# Patient Record
Sex: Female | Born: 1965 | Race: Black or African American | Hispanic: No | Smoking: Former smoker
Health system: Southern US, Community
[De-identification: ages and names within clinical notes are randomized; demographics above are authoritative.]

## PROBLEM LIST (undated history)

## (undated) DIAGNOSIS — J302 Other seasonal allergic rhinitis: Secondary | ICD-10-CM

## (undated) DIAGNOSIS — M1990 Unspecified osteoarthritis, unspecified site: Secondary | ICD-10-CM

## (undated) HISTORY — PX: BILATERAL CARPAL TUNNEL RELEASE: SHX6508

## (undated) HISTORY — PX: ABDOMINAL HYSTERECTOMY: SHX81

## (undated) HISTORY — PX: SALPINGECTOMY: SHX328

---

## 2008-10-31 ENCOUNTER — Emergency Department (HOSPITAL_COMMUNITY): Admission: EM | Admit: 2008-10-31 | Discharge: 2008-11-01 | Payer: Self-pay | Admitting: Emergency Medicine

## 2009-11-27 ENCOUNTER — Inpatient Hospital Stay (HOSPITAL_COMMUNITY)
Admission: EM | Admit: 2009-11-27 | Discharge: 2009-12-01 | Payer: Self-pay | Source: Home / Self Care | Admitting: Emergency Medicine

## 2009-11-29 ENCOUNTER — Encounter: Payer: Self-pay | Admitting: Obstetrics and Gynecology

## 2009-12-27 ENCOUNTER — Inpatient Hospital Stay (HOSPITAL_COMMUNITY)
Admission: RE | Admit: 2009-12-27 | Discharge: 2009-12-30 | Payer: Self-pay | Source: Home / Self Care | Attending: Obstetrics and Gynecology | Admitting: Obstetrics and Gynecology

## 2009-12-27 ENCOUNTER — Encounter: Payer: Self-pay | Admitting: Obstetrics and Gynecology

## 2010-03-03 ENCOUNTER — Emergency Department (HOSPITAL_COMMUNITY)
Admission: EM | Admit: 2010-03-03 | Discharge: 2010-03-03 | Disposition: A | Discharge status: Home / Self Care | Payer: BC Managed Care – PPO | Attending: Emergency Medicine | Admitting: Emergency Medicine

## 2010-03-03 DIAGNOSIS — R109 Unspecified abdominal pain: Secondary | ICD-10-CM | POA: Insufficient documentation

## 2010-03-03 DIAGNOSIS — G8918 Other acute postprocedural pain: Secondary | ICD-10-CM | POA: Insufficient documentation

## 2010-03-03 LAB — BASIC METABOLIC PANEL
CO2: 22 mEq/L (ref 19–32)
Calcium: 8.9 mg/dL (ref 8.4–10.5)
Chloride: 109 mEq/L (ref 96–112)
Creatinine, Ser: 0.82 mg/dL (ref 0.4–1.2)
GFR calc Af Amer: >60 mL/min (ref >60)
Sodium: 137 mEq/L (ref 135–145)

## 2010-03-03 LAB — URINALYSIS, ROUTINE W REFLEX MICROSCOPIC
Bilirubin Urine: NEGATIVE
Hgb urine dipstick: NEGATIVE
Protein, ur: NEGATIVE mg/dL
Urobilinogen, UA: 0.2 mg/dL (ref 0.0–1.0)

## 2010-03-03 LAB — POCT I-STAT, CHEM 8
Calcium, Ion: 1.1 mmol/L — ABNORMAL LOW (ref 1.12–1.32)
Creatinine, Ser: 0.9 mg/dL (ref 0.4–1.2)
Glucose, Bld: 92 mg/dL (ref 70–99)
HCT: 35 % — ABNORMAL LOW (ref 36.0–46.0)
Potassium: 3.8 mEq/L (ref 3.5–5.1)

## 2010-03-28 LAB — DIFFERENTIAL
Basophils Absolute: 0 10*3/uL (ref 0.0–0.1)
Basophils Relative: 1 % (ref 0–1)
Eosinophils Absolute: 0 10*3/uL (ref 0.0–0.7)
Eosinophils Absolute: 0 10*3/uL (ref 0.0–0.7)
Eosinophils Relative: 3 % (ref 0–5)
Eosinophils Relative: 0 % (ref 0–5)
Eosinophils Relative: 0 % (ref 0–5)
Lymphocytes Relative: 27 % (ref 12–46)
Lymphocytes Relative: 13 % (ref 12–46)
Lymphocytes Relative: 14 % (ref 12–46)
Lymphs Abs: 1.7 10*3/uL (ref 0.7–4.0)
Lymphs Abs: 0.8 10*3/uL (ref 0.7–4.0)
Lymphs Abs: 1.2 10*3/uL (ref 0.7–4.0)
Lymphs Abs: 1.5 10*3/uL (ref 0.7–4.0)
Lymphs Abs: 1.6 10*3/uL (ref 0.7–4.0)
Monocytes Absolute: 0.6 10*3/uL (ref 0.1–1.0)
Monocytes Relative: 6 % (ref 3–12)
Monocytes Relative: 7 % (ref 3–12)
Neutro Abs: 5.8 10*3/uL (ref 1.7–7.7)
Neutro Abs: 8.5 10*3/uL — ABNORMAL HIGH (ref 1.7–7.7)
Neutro Abs: 9.9 10*3/uL — ABNORMAL HIGH (ref 1.7–7.7)
Neutrophils Relative %: 68 % (ref 43–77)
Neutrophils Relative %: 79 % — ABNORMAL HIGH (ref 43–77)
Neutrophils Relative %: 80 % — ABNORMAL HIGH (ref 43–77)
Neutrophils Relative %: 85 % — ABNORMAL HIGH (ref 43–77)

## 2010-03-28 LAB — GC/CHLAMYDIA PROBE AMP, GENITAL
GC Probe Amp, Genital: NEGATIVE
GC Probe Amp, Genital: NEGATIVE

## 2010-03-28 LAB — COMPREHENSIVE METABOLIC PANEL
ALT: 15 U/L (ref 0–35)
ALT: 13 U/L (ref 0–35)
AST: 18 U/L (ref 0–37)
AST: 16 U/L (ref 0–37)
Alkaline Phosphatase: 82 U/L (ref 39–117)
CO2: 23 mEq/L (ref 19–32)
Calcium: 8.8 mg/dL (ref 8.4–10.5)
Chloride: 105 mEq/L (ref 96–112)
GFR calc Af Amer: >60 mL/min (ref >60)
GFR calc Af Amer: >60 mL/min (ref >60)
GFR calc non Af Amer: >60 mL/min (ref >60)
Potassium: 4.2 mEq/L (ref 3.5–5.1)
Sodium: 138 mEq/L (ref 135–145)
Sodium: 135 mEq/L (ref 135–145)
Total Bilirubin: 0.6 mg/dL (ref 0.3–1.2)
Total Protein: 7.1 g/dL (ref 6.0–8.3)

## 2010-03-28 LAB — URINE CULTURE: Colony Count: 50000

## 2010-03-28 LAB — POCT I-STAT 4, (NA,K, GLUC, HGB,HCT)
HCT: 30 % — ABNORMAL LOW (ref 36.0–46.0)
Hemoglobin: 10.2 g/dL — ABNORMAL LOW (ref 12.0–15.0)
Potassium: 4.2 mEq/L (ref 3.5–5.1)

## 2010-03-28 LAB — WOUND CULTURE: Culture: NO GROWTH

## 2010-03-28 LAB — CBC
Hemoglobin: 8.2 g/dL — ABNORMAL LOW (ref 12.0–15.0)
Hemoglobin: 9.7 g/dL — ABNORMAL LOW (ref 12.0–15.0)
Hemoglobin: 10.4 g/dL — ABNORMAL LOW (ref 12.0–15.0)
Hemoglobin: 9.4 g/dL — ABNORMAL LOW (ref 12.0–15.0)
Hemoglobin: 9.6 g/dL — ABNORMAL LOW (ref 12.0–15.0)
MCH: 24.8 pg — ABNORMAL LOW (ref 26.0–34.0)
MCHC: 32.3 g/dL (ref 30.0–36.0)
MCHC: 32.9 g/dL (ref 30.0–36.0)
MCV: 76.9 fL — ABNORMAL LOW (ref 78.0–100.0)
MCV: 76 fL — ABNORMAL LOW (ref 78.0–100.0)
MCV: 76.6 fL — ABNORMAL LOW (ref 78.0–100.0)
Platelets: 403 10*3/uL — ABNORMAL HIGH (ref 150–400)
Platelets: 373 10*3/uL (ref 150–400)
Platelets: 407 10*3/uL — ABNORMAL HIGH (ref 150–400)
RBC: 3.42 MIL/uL — ABNORMAL LOW (ref 3.87–5.11)
RBC: 3.45 MIL/uL — ABNORMAL LOW (ref 3.87–5.11)
RBC: 3.78 MIL/uL — ABNORMAL LOW (ref 3.87–5.11)
RBC: 3.87 MIL/uL (ref 3.87–5.11)
RBC: 4.22 MIL/uL (ref 3.87–5.11)
RDW: 17.9 % — ABNORMAL HIGH (ref 11.5–15.5)
RDW: 19.9 % — ABNORMAL HIGH (ref 11.5–15.5)
WBC: 4.2 10*3/uL (ref 4.0–10.5)
WBC: 6.4 10*3/uL (ref 4.0–10.5)
WBC: 10.7 10*3/uL — ABNORMAL HIGH (ref 4.0–10.5)
WBC: 12.3 10*3/uL — ABNORMAL HIGH (ref 4.0–10.5)
WBC: 12.9 10*3/uL — ABNORMAL HIGH (ref 4.0–10.5)

## 2010-03-28 LAB — CULTURE, BLOOD (ROUTINE X 2): Culture: NO GROWTH

## 2010-03-28 LAB — URINE MICROSCOPIC-ADD ON

## 2010-03-28 LAB — ANAEROBIC CULTURE

## 2010-03-28 LAB — URINALYSIS, ROUTINE W REFLEX MICROSCOPIC
Bilirubin Urine: NEGATIVE
Glucose, UA: 100 mg/dL — AB
Ketones, ur: NEGATIVE mg/dL
pH: 7 (ref 5.0–8.0)

## 2010-03-28 LAB — CROSSMATCH
Antibody Screen: NEGATIVE
Unit division: 0

## 2010-03-28 LAB — BASIC METABOLIC PANEL
BUN: 6 mg/dL (ref 6–23)
Calcium: 8.2 mg/dL — ABNORMAL LOW (ref 8.4–10.5)
Chloride: 106 mEq/L (ref 96–112)
Chloride: 104 mEq/L (ref 96–112)
Creatinine, Ser: 0.79 mg/dL (ref 0.4–1.2)
Creatinine, Ser: 0.8 mg/dL (ref 0.4–1.2)
GFR calc Af Amer: >60 mL/min (ref >60)
GFR calc Af Amer: >60 mL/min (ref >60)
GFR calc non Af Amer: >60 mL/min (ref >60)
Glucose, Bld: 102 mg/dL — ABNORMAL HIGH (ref 70–99)
Potassium: 4.2 mEq/L (ref 3.5–5.1)
Sodium: 135 mEq/L (ref 135–145)
Sodium: 132 mEq/L — ABNORMAL LOW (ref 135–145)

## 2010-03-28 LAB — WET PREP, GENITAL: Yeast Wet Prep HPF POC: NONE SEEN

## 2010-03-28 LAB — GLUCOSE, CAPILLARY: Glucose-Capillary: 98 mg/dL (ref 70–99)

## 2010-03-28 LAB — SURGICAL PCR SCREEN
MRSA, PCR: NEGATIVE
Staphylococcus aureus: NEGATIVE

## 2010-03-28 LAB — POCT PREGNANCY, URINE: Preg Test, Ur: NEGATIVE

## 2010-04-20 LAB — URINALYSIS, ROUTINE W REFLEX MICROSCOPIC
Ketones, ur: 15 mg/dL — AB
Leukocytes, UA: NEGATIVE
Nitrite: NEGATIVE
Protein, ur: 30 mg/dL — AB

## 2010-04-20 LAB — PREGNANCY, URINE: Preg Test, Ur: NEGATIVE

## 2010-10-09 ENCOUNTER — Emergency Department (HOSPITAL_COMMUNITY)
Admission: EM | Admit: 2010-10-09 | Discharge: 2010-10-09 | Disposition: A | Discharge status: Home / Self Care | Payer: BC Managed Care – PPO | Attending: Emergency Medicine | Admitting: Emergency Medicine

## 2010-10-09 ENCOUNTER — Encounter: Payer: Self-pay | Admitting: *Deleted

## 2010-10-09 DIAGNOSIS — S0181XA Laceration without foreign body of other part of head, initial encounter: Secondary | ICD-10-CM

## 2010-10-09 DIAGNOSIS — S0180XA Unspecified open wound of other part of head, initial encounter: Secondary | ICD-10-CM | POA: Insufficient documentation

## 2010-10-09 DIAGNOSIS — IMO0002 Reserved for concepts with insufficient information to code with codable children: Secondary | ICD-10-CM | POA: Insufficient documentation

## 2010-10-09 DIAGNOSIS — Y92009 Unspecified place in unspecified non-institutional (private) residence as the place of occurrence of the external cause: Secondary | ICD-10-CM | POA: Insufficient documentation

## 2010-10-09 MED ORDER — LIDOCAINE-EPINEPHRINE (PF) 2 %-1:200000 IJ SOLN
INTRAMUSCULAR | Status: AC
  Administered 2010-10-09: 14:00:00 via INTRADERMAL
  Filled 2010-10-09: qty 20

## 2010-10-09 MED ORDER — BACITRACIN ZINC 500 UNIT/GM EX OINT
TOPICAL_OINTMENT | Freq: Once | CUTANEOUS | Status: DC
  Filled 2010-10-09: qty 0.9

## 2010-10-09 MED ORDER — LIDOCAINE-EPINEPHRINE 1 %-1:100000 IJ SOLN
10.0000 mL | Freq: Once | INTRAMUSCULAR | Status: DC
  Filled 2010-10-09: qty 10

## 2010-10-09 NOTE — ED Provider Notes (Signed)
Medical screening examination/treatment/procedure(s) were performed by non-physician practitioner and as supervising physician I was immediately available for consultation/collaboration.   Charles B. Bernette Mayers, MD 10/09/10 [phone]

## 2010-10-09 NOTE — ED Provider Notes (Signed)
History     CSN: [id number] Arrival date & time: 10/09/2010 12:17 PM  Chief Complaint  Patient presents with  . Facial Laceration    HPI  (Consider location/radiation/quality/duration/timing/severity/associated sxs/prior treatment)  HPI Comments: Pt opened a cabinet door this AM and struck/lacerated chin.  No other injuries.  The history is provided by the patient. No language interpreter was used.    History reviewed. No pertinent past medical history.  Past Surgical History  Procedure Date  . Abdominal hysterectomy   . Salpingectomy     History reviewed. No pertinent family history.  History  Substance Use Topics  . Smoking status: Former Games developer  . Smokeless tobacco: Not on file  . Alcohol Use: No    OB History    Grav Para Term Preterm Abortions TAB SAB Ect Mult Living                  Review of Systems  Review of Systems  Skin: Positive for wound.  All other systems reviewed and are negative.    Allergies  Other  Home Medications   Current Outpatient Rx  Name Route Sig Dispense Refill  . CETIRIZINE HCL 10 MG PO TABS Oral Take 10 mg by mouth daily.      Marland Kitchen HAWTHORN BERRY PO Oral Take 1 tablet by mouth daily.      . IBUPROFEN 200 MG PO TABS Oral Take 400 mg by mouth every 6 (six) hours as needed. Pain       Physical Exam    BP 136/80  Pulse 61  Temp(Src) 97.7 F (36.5 C) (Oral)  Resp 16  Ht 5\' 6"  (1.676 m)  Wt 214 lb 6 oz (97.24 kg)  BMI 34.60 kg/m2  SpO2 100%  Physical Exam  Nursing note and vitals reviewed. Constitutional: She is oriented to person, place, and time. She appears well-developed and well-nourished. No distress.  HENT:  Head: Normocephalic. Head is with laceration.    Eyes: EOM are normal.  Neck: Normal range of motion.  Cardiovascular: Normal rate, regular rhythm and normal heart sounds.   Pulmonary/Chest: Effort normal and breath sounds normal.  Abdominal: Soft. She exhibits no distension. There is no tenderness.    Musculoskeletal: Normal range of motion.  Neurological: She is alert and oriented to person, place, and time. She has normal strength. No cranial nerve deficit or sensory deficit. GCS eye subscore is 4. GCS verbal subscore is 5. GCS motor subscore is 6.  Skin: Skin is warm and dry. She is not diaphoretic.  Psychiatric: She has a normal mood and affect. Judgment normal.    ED Course  LACERATION REPAIR Date/Time: 10/09/2010 1:40 PM Performed by: Worthy Rancher Authorized by: Pollyann Savoy Consent: Verbal consent obtained. Written consent not obtained. Risks and benefits: risks, benefits and alternatives were discussed Consent given by: patient Patient understanding: patient states understanding of the procedure being performed Patient consent: the patient's understanding of the procedure matches consent given Imaging studies: imaging studies not available Required items: required blood products, implants, devices, and special equipment available Patient identity confirmed: verbally with patient Time out: Immediately prior to procedure a "time out" was called to verify the correct patient, procedure, equipment, support staff and site/side marked as required. Body area: head/neck Location details: chin Laceration length: 1 cm Foreign bodies: no foreign bodies Tendon involvement: none Nerve involvement: none Vascular damage: no Anesthesia: local infiltration Local anesthetic: lidocaine 1% with epinephrine Anesthetic total: 2.5 ml Patient sedated: no Preparation: Patient was  prepped and draped in the usual sterile fashion. Irrigation solution: saline Irrigation method: syringe Amount of cleaning: standard Debridement: none Degree of undermining: none Skin closure: 6-0 Prolene Number of sutures: 3 Technique: simple Approximation: close Approximation difficulty: simple Dressing: 4x4 sterile gauze and antibiotic ointment Patient tolerance: Patient tolerated the procedure  well with no immediate complications.   (including critical care time)  Labs Reviewed - No data to display No results found.   No diagnosis found.   MDM         Worthy Rancher, PA 10/09/10 [phone]

## 2010-10-09 NOTE — ED Notes (Signed)
Pt has laceration to her chin area. Pt states that she hit it on the cabinet door.

## 2011-07-22 ENCOUNTER — Emergency Department (HOSPITAL_COMMUNITY)
Admission: EM | Admit: 2011-07-22 | Discharge: 2011-07-22 | Disposition: A | Discharge status: Home / Self Care | Payer: Worker's Compensation | Attending: Emergency Medicine | Admitting: Emergency Medicine

## 2011-07-22 ENCOUNTER — Encounter (HOSPITAL_COMMUNITY): Payer: Self-pay | Admitting: Emergency Medicine

## 2011-07-22 DIAGNOSIS — S61419A Laceration without foreign body of unspecified hand, initial encounter: Secondary | ICD-10-CM

## 2011-07-22 DIAGNOSIS — Z23 Encounter for immunization: Secondary | ICD-10-CM | POA: Insufficient documentation

## 2011-07-22 DIAGNOSIS — Y990 Civilian activity done for income or pay: Secondary | ICD-10-CM | POA: Insufficient documentation

## 2011-07-22 DIAGNOSIS — W278XXA Contact with other nonpowered hand tool, initial encounter: Secondary | ICD-10-CM | POA: Insufficient documentation

## 2011-07-22 DIAGNOSIS — S61409A Unspecified open wound of unspecified hand, initial encounter: Secondary | ICD-10-CM | POA: Insufficient documentation

## 2011-07-22 MED ORDER — TETANUS-DIPHTH-ACELL PERTUSSIS 5-2.5-18.5 LF-MCG/0.5 IM SUSP
0.5000 mL | Freq: Once | INTRAMUSCULAR | Status: AC
  Administered 2011-07-22: 0.5 mL via INTRAMUSCULAR
  Filled 2011-07-22: qty 0.5

## 2011-07-22 NOTE — ED Notes (Signed)
Pt alert, nad, arrives from work, c/o laceration to left palm, onset this evening, pt has 0.5 cm "v" shaped laceration, bleeding stopped, DSD in place, last tet ?, resp even unlabored, skin pwd

## 2011-07-22 NOTE — ED Provider Notes (Signed)
Medical screening examination/treatment/procedure(s) were performed by non-physician practitioner and as supervising physician I was immediately available for consultation/collaboration.    Deuntae Kocsis D Montravious Weigelt, MD 07/22/11 0729 

## 2011-07-22 NOTE — ED Provider Notes (Signed)
History     CSN: [id number]  Arrival date & time 07/22/11  0122   First MD Initiated Contact with Patient 07/22/11 [phone]      Chief Complaint  Patient presents with  . Extremity Laceration   HPI  History provided by the patient. Patient is a 46 year old female with no significant past medical history presents with complaints of puncture laceration to left palm of hand while at work. Patient was working with a box cutting device that had a razor blade at the end and axial he slipped puncturing her left palm with the edge of the razor blade. Patient states the razor blade within only a few millimeters. She has a small V-shaped laceration to her palm. She complains of pain over the area. She denies any reduced range of motion. She denies any decreased sensation in fingers. Pain is described as mild-to-moderate. It was small amounts associated bleeding but this was controlled. Patient does not recall her last tetanus shot. She denies any other associated symptoms.    History reviewed. No pertinent past medical history.  Past Surgical History  Procedure Date  . Abdominal hysterectomy   . Salpingectomy     No family history on file.  History  Substance Use Topics  . Smoking status: Former Games developer  . Smokeless tobacco: Not on file  . Alcohol Use: No    OB History    Grav Para Term Preterm Abortions TAB SAB Ect Mult Living                  Review of Systems  Skin:       Laceration of left palm  Neurological: Negative for weakness, light-headedness and numbness.    Allergies  Other  Home Medications  No current outpatient prescriptions on file.  BP 128/77  Pulse 70  Temp 98 F (36.7 C)  Resp 16  SpO2 99%  Physical Exam  Nursing note and vitals reviewed. Constitutional: She is oriented to person, place, and time. She appears well-developed and well-nourished. No distress.  HENT:  Head: Normocephalic.  Cardiovascular: Normal rate and regular rhythm.     Pulmonary/Chest: Effort normal and breath sounds normal.  Musculoskeletal:       0.5 cm V-shaped laceration to left palm near the thenar eminence. Patient has full range of motion in all digits. Normal distal sensations with normal cap refill.  Neurological: She is alert and oriented to person, place, and time.  Skin: Skin is warm.  Psychiatric: She has a normal mood and affect. Her behavior is normal.    ED Course  Procedures  LACERATION REPAIR Performed by: Angus Seller Authorized by: Angus Seller Consent: Verbal consent obtained. Risks and benefits: risks, benefits and alternatives were discussed Consent given by: patient Patient identity confirmed: provided demographic data Prepped and Draped in normal sterile fashion Wound explored  Laceration Location: Palm of left hand  Laceration Length: 0.5 cm  No Foreign Bodies seen or palpated  Anesthesia: local infiltration  Local anesthetic: lidocaine 2 % without epinephrine  Anesthetic total: 2 ml  Irrigation method: syringe Amount of cleaning: standard  Skin closure: Skin with 3-0 nylon   Number of sutures: 2   Technique: Simple interrupted   Patient tolerance: Patient tolerated the procedure well with no immediate complications.      1. Laceration of hand       MDM  3:30AM patient seen and evaluated.  Tetanus given      Angus Seller, [place] 07/22/11 [phone]

## 2011-07-22 NOTE — ED Notes (Signed)
Patient given discharge instructions, information, prescriptions, and diet order. Patient states that they adequately understand discharge information given and to return to ED if symptoms return or worsen.     

## 2012-04-27 ENCOUNTER — Emergency Department (HOSPITAL_COMMUNITY)
Admission: EM | Admit: 2012-04-27 | Discharge: 2012-04-27 | Disposition: A | Discharge status: Home / Self Care | Payer: BC Managed Care – PPO | Attending: Emergency Medicine | Admitting: Emergency Medicine

## 2012-04-27 ENCOUNTER — Other Ambulatory Visit: Payer: Self-pay

## 2012-04-27 ENCOUNTER — Emergency Department (HOSPITAL_COMMUNITY): Payer: BC Managed Care – PPO

## 2012-04-27 ENCOUNTER — Encounter (HOSPITAL_COMMUNITY): Payer: Self-pay | Admitting: Emergency Medicine

## 2012-04-27 DIAGNOSIS — R296 Repeated falls: Secondary | ICD-10-CM | POA: Insufficient documentation

## 2012-04-27 DIAGNOSIS — Y939 Activity, unspecified: Secondary | ICD-10-CM | POA: Insufficient documentation

## 2012-04-27 DIAGNOSIS — F17200 Nicotine dependence, unspecified, uncomplicated: Secondary | ICD-10-CM | POA: Insufficient documentation

## 2012-04-27 DIAGNOSIS — Z79899 Other long term (current) drug therapy: Secondary | ICD-10-CM | POA: Insufficient documentation

## 2012-04-27 DIAGNOSIS — R42 Dizziness and giddiness: Secondary | ICD-10-CM | POA: Insufficient documentation

## 2012-04-27 DIAGNOSIS — I10 Essential (primary) hypertension: Secondary | ICD-10-CM | POA: Insufficient documentation

## 2012-04-27 DIAGNOSIS — Z8739 Personal history of other diseases of the musculoskeletal system and connective tissue: Secondary | ICD-10-CM | POA: Insufficient documentation

## 2012-04-27 DIAGNOSIS — Y929 Unspecified place or not applicable: Secondary | ICD-10-CM | POA: Insufficient documentation

## 2012-04-27 HISTORY — DX: Unspecified osteoarthritis, unspecified site: M19.90

## 2012-04-27 LAB — COMPREHENSIVE METABOLIC PANEL
AST: 18 U/L (ref 0–37)
Albumin: 3.3 g/dL — ABNORMAL LOW (ref 3.5–5.2)
Alkaline Phosphatase: 93 U/L (ref 39–117)
Chloride: 103 mEq/L (ref 96–112)
Potassium: 3.4 mEq/L — ABNORMAL LOW (ref 3.5–5.1)
Total Bilirubin: 0.1 mg/dL — ABNORMAL LOW (ref 0.3–1.2)
Total Protein: 7.4 g/dL (ref 6.0–8.3)

## 2012-04-27 LAB — CBC WITH DIFFERENTIAL/PLATELET
Basophils Absolute: 0.1 10*3/uL (ref 0.0–0.1)
Basophils Relative: 1 % (ref 0–1)
Eosinophils Absolute: 0.3 10*3/uL (ref 0.0–0.7)
MCH: 26.6 pg (ref 26.0–34.0)
MCHC: 32.6 g/dL (ref 30.0–36.0)
Neutro Abs: 2.4 10*3/uL (ref 1.7–7.7)
Neutrophils Relative %: 39 % — ABNORMAL LOW (ref 43–77)
Platelets: 398 10*3/uL (ref 150–400)
RDW: 13.9 % (ref 11.5–15.5)

## 2012-04-27 NOTE — ED Notes (Signed)
Pt states she feels off balance more often as "though I can't catch myself". Pt states that she fell this morning walking to the bathroom and that this is the third time in a year she has fallen from these periods of "off balance" Pt denies any LOC and states that "I'm aware the entire time".

## 2012-04-27 NOTE — ED Provider Notes (Addendum)
History     CSN: [id number]  Arrival date & time 04/27/12  0454   First MD Initiated Contact with Patient 04/27/12 0518      Chief Complaint  Patient presents with  . Fall    (Consider location/radiation/quality/duration/timing/severity/associated sxs/prior treatment) HPI Comments: Patient presents with episodic dizziness over the past year.  She has experienced about a total of 6 of these episodes over this time.  She will start with feeling dizzy and then starts to lean to the left.  She has actually fallen twice.  This morning she got out of bed to go to the bathroom when she was unable to gauge when to stop.  She then fell over.  Her husband then insisted she be checked out.  She denies headache, ear pain, ringing in the ears, visual complaints.    The history is provided by the patient.    Past Medical History  Diagnosis Date  . Hypertension   . Arthritis     Past Surgical History  Procedure Laterality Date  . Abdominal hysterectomy    . Salpingectomy      No family history on file.  History  Substance Use Topics  . Smoking status: Light Tobacco Smoker  . Smokeless tobacco: Not on file  . Alcohol Use: No    OB History   Grav Para Term Preterm Abortions TAB SAB Ect Mult Living                  Review of Systems  All other systems reviewed and are negative.    Allergies  Other  Home Medications   Current Outpatient Rx  Name  Route  Sig  Dispense  Refill  . bisoprolol-hydrochlorothiazide (ZIAC) 5-6.25 MG per tablet   Oral   Take 1 tablet by mouth daily.         . traMADol (ULTRAM) 50 MG tablet   Oral   Take 50 mg by mouth every 6 (six) hours as needed for pain.           BP 119/78  Pulse 60  Temp(Src) 97.8 F (36.6 C) (Oral)  Resp 18  Ht 5\' 6"  (1.676 m)  Wt 195 lb (88.451 kg)  BMI 31.49 kg/m2  SpO2 98%  Physical Exam  Nursing note and vitals reviewed. Constitutional: She is oriented to person, place, and time. She appears  well-developed and well-nourished. No distress.  HENT:  Head: Normocephalic and atraumatic.  Neck: Normal range of motion. Neck supple.  Cardiovascular: Normal rate and regular rhythm.  Exam reveals no gallop and no friction rub.   No murmur heard. Pulmonary/Chest: Effort normal and breath sounds normal. No respiratory distress. She has no wheezes.  Abdominal: Soft. Bowel sounds are normal. She exhibits no distension. There is no tenderness.  Musculoskeletal: Normal range of motion.  Neurological: She is alert and oriented to person, place, and time. No cranial nerve deficit. She exhibits normal muscle tone. Coordination normal.  Skin: Skin is warm and dry. She is not diaphoretic.    ED Course  Procedures (including critical care time)  Labs Reviewed  CBC WITH DIFFERENTIAL  COMPREHENSIVE METABOLIC PANEL   No results found.   No diagnosis found.   Date: 04/27/2012  Rate: 57  Rhythm: sinus bradycardia  QRS Axis: normal  Intervals: normal  ST/T Wave abnormalities: normal  Conduction Disutrbances:none  Narrative Interpretation:   Old EKG Reviewed: none available    MDM  The patient presents here with dizzy episodes for over one  year.  There was another episode this morning which caused her fall.  The neurological exam is non-focal and the workup including cbc, cmp, ekg, and head ct are all unremarkable.  At this point, I feel as though she is stable for discharge, however may benefit from neurology consultation.          Sudie Grumbling, MD 04/27/12 1610  Sudie Grumbling, MD 04/27/12 [phone]

## 2012-04-27 NOTE — ED Notes (Signed)
Pt states she got up to use the restroom and felt "like I was walking sidewides". States she fell into the door.  Patient states this is the third episode she has had in a year and reports that she feels as though she is losing her balance more often.

## 2012-12-12 ENCOUNTER — Ambulatory Visit (HOSPITAL_COMMUNITY): Payer: Self-pay

## 2012-12-12 ENCOUNTER — Other Ambulatory Visit (HOSPITAL_COMMUNITY): Payer: Self-pay | Admitting: Family Medicine

## 2012-12-12 DIAGNOSIS — M545 Low back pain, unspecified: Secondary | ICD-10-CM

## 2012-12-15 ENCOUNTER — Other Ambulatory Visit (HOSPITAL_COMMUNITY): Payer: Self-pay | Admitting: Family Medicine

## 2012-12-15 ENCOUNTER — Ambulatory Visit (HOSPITAL_COMMUNITY)
Admission: RE | Admit: 2012-12-15 | Discharge: 2012-12-15 | Disposition: A | Discharge status: Home / Self Care | Payer: BC Managed Care – PPO | Source: Ambulatory Visit | Attending: Family Medicine | Admitting: Family Medicine

## 2012-12-15 DIAGNOSIS — M549 Dorsalgia, unspecified: Secondary | ICD-10-CM | POA: Insufficient documentation

## 2012-12-15 DIAGNOSIS — Q762 Congenital spondylolisthesis: Secondary | ICD-10-CM | POA: Insufficient documentation

## 2012-12-15 DIAGNOSIS — Z139 Encounter for screening, unspecified: Secondary | ICD-10-CM

## 2012-12-15 DIAGNOSIS — M25559 Pain in unspecified hip: Secondary | ICD-10-CM | POA: Insufficient documentation

## 2012-12-15 DIAGNOSIS — M545 Low back pain: Secondary | ICD-10-CM

## 2012-12-16 ENCOUNTER — Ambulatory Visit (HOSPITAL_COMMUNITY)
Admission: RE | Admit: 2012-12-16 | Discharge: 2012-12-16 | Disposition: A | Discharge status: Home / Self Care | Payer: BC Managed Care – PPO | Source: Ambulatory Visit | Attending: Family Medicine | Admitting: Family Medicine

## 2012-12-16 DIAGNOSIS — Z1231 Encounter for screening mammogram for malignant neoplasm of breast: Secondary | ICD-10-CM | POA: Insufficient documentation

## 2012-12-16 DIAGNOSIS — Z139 Encounter for screening, unspecified: Secondary | ICD-10-CM

## 2012-12-16 IMAGING — MG MM DIGITAL SCREENING BILATERAL
4 series · 4 of 4 positions shown · non-contrast
Comparison: Previous exam(s).

CLINICAL DATA: Screening.

EXAM:
DIGITAL SCREENING BILATERAL MAMMOGRAM WITH CAD

[L CC]
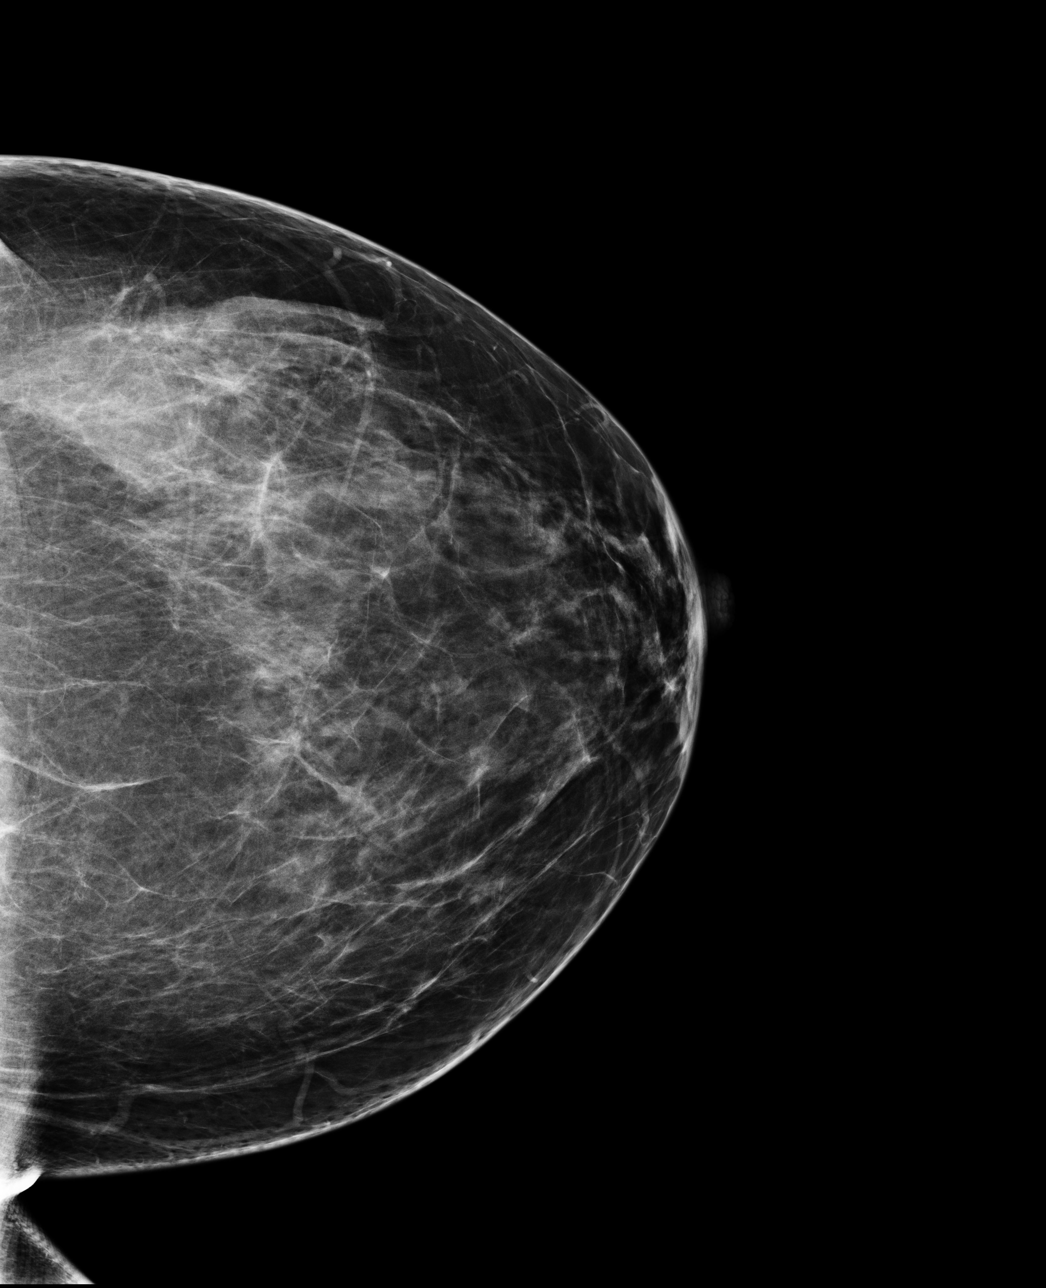

[L MLO]
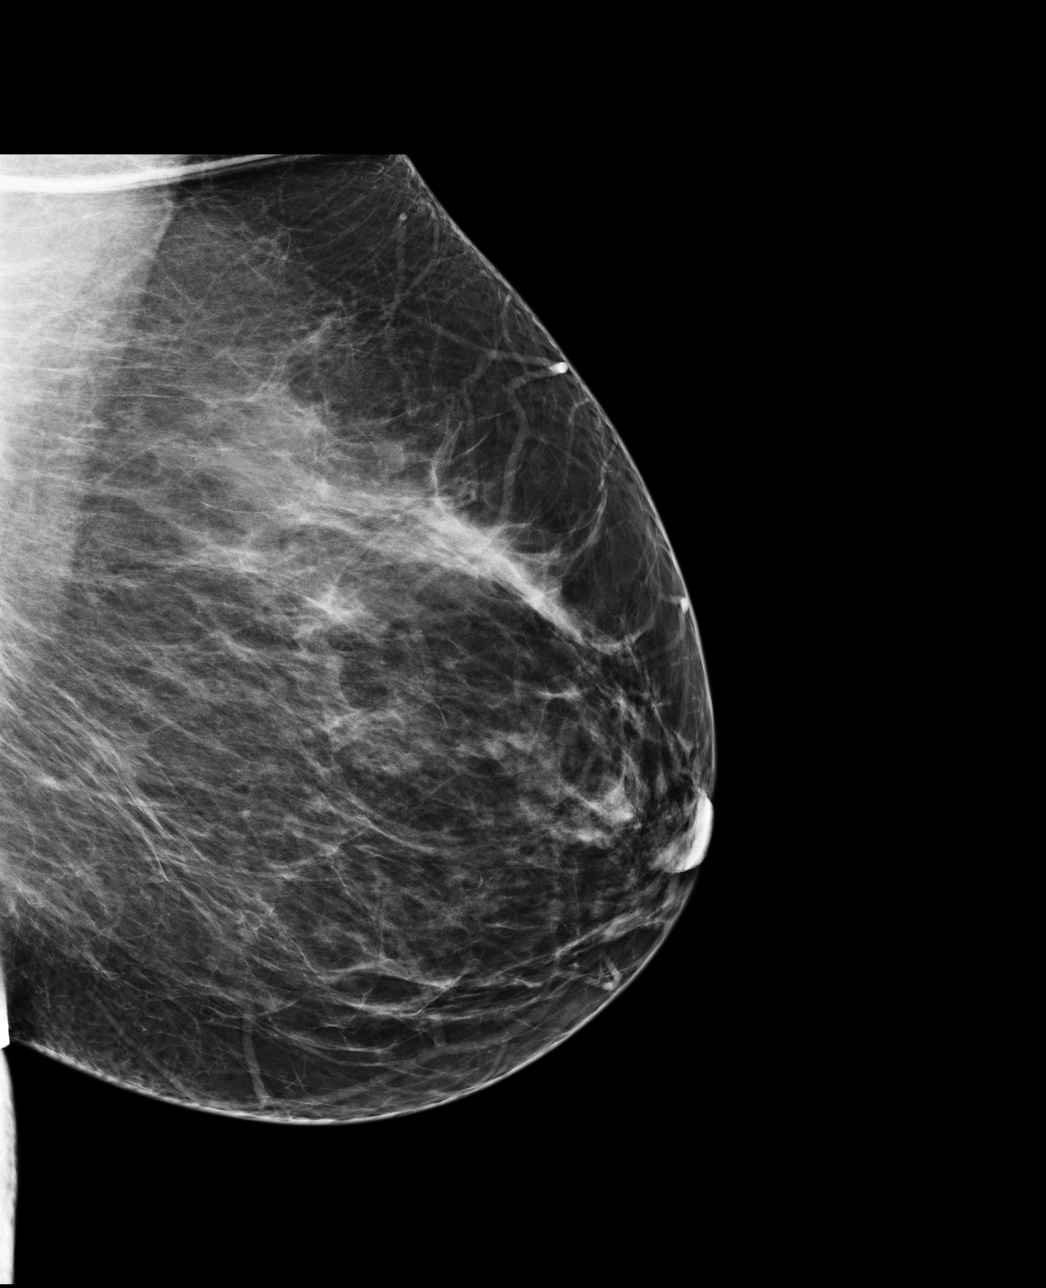

[R CC]
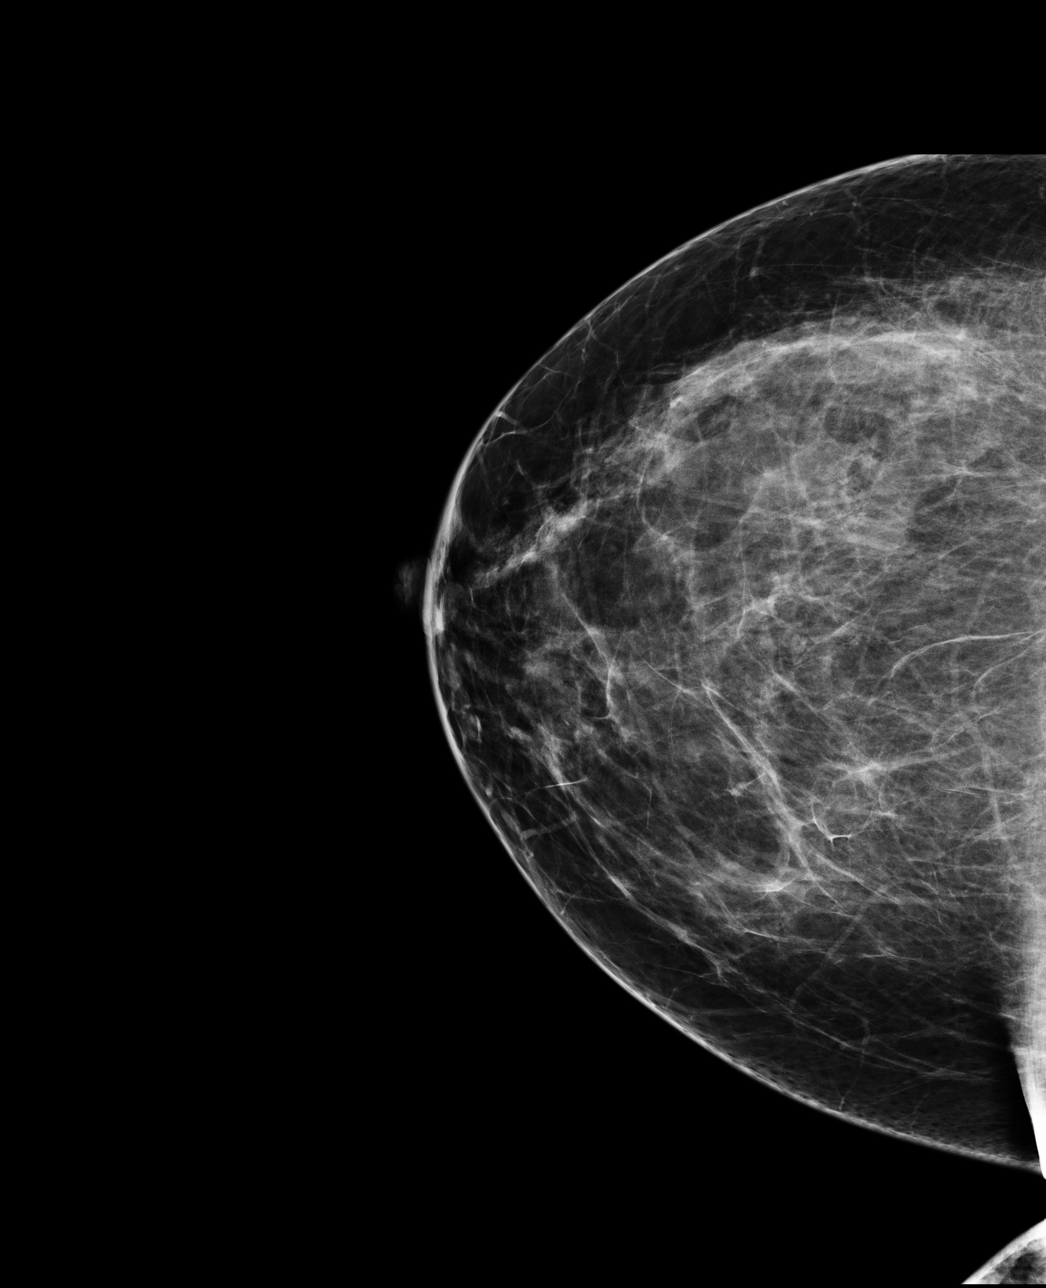

[R MLO]
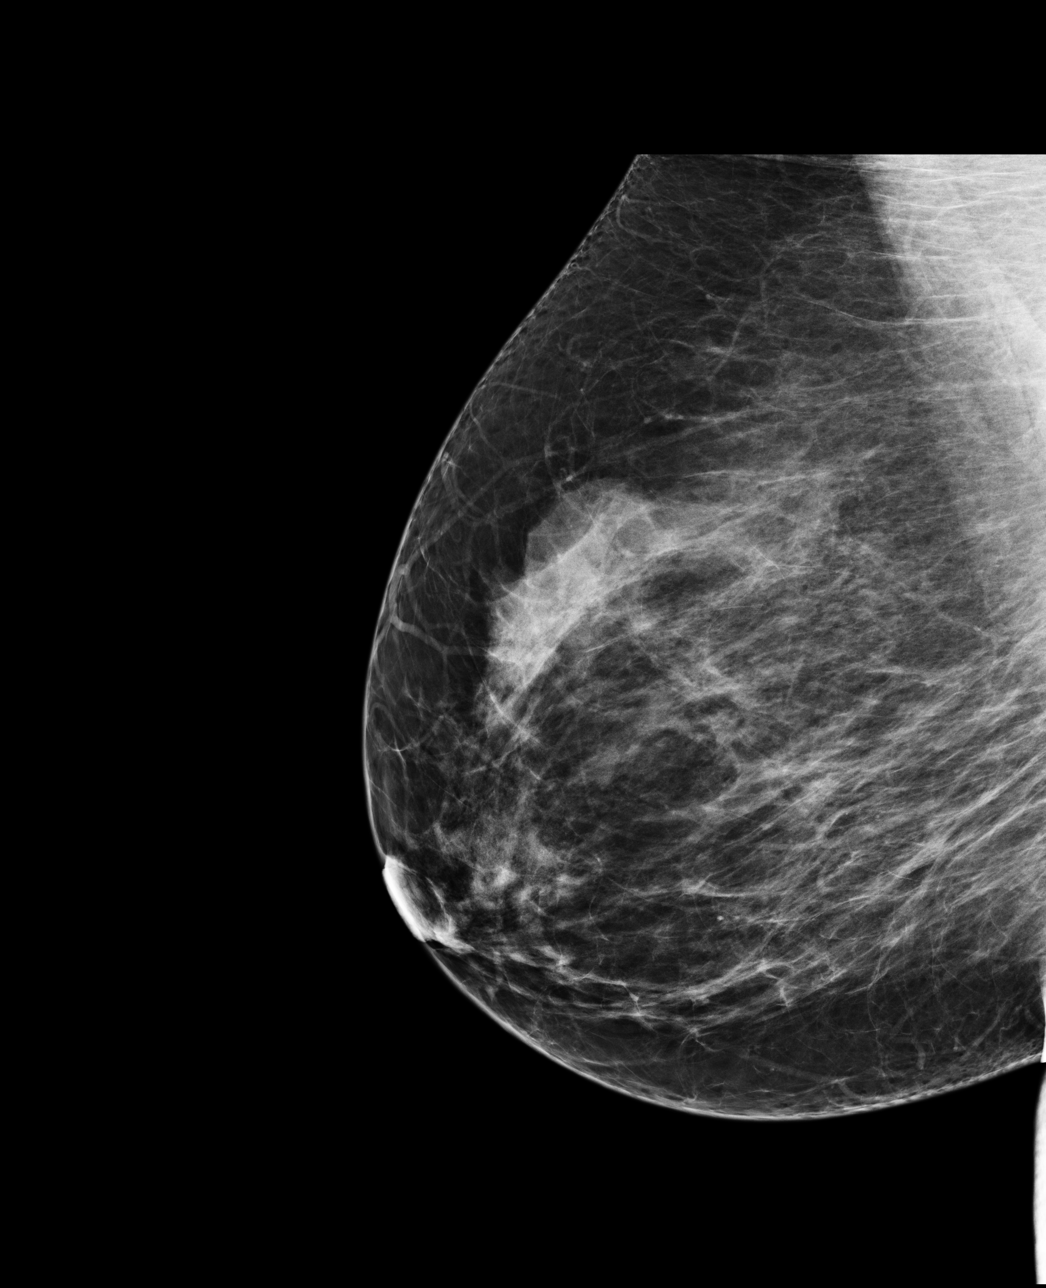

[4 of 4 positions shown; findings below may reference images not displayed]

ACR Breast Density Category b: There are scattered areas of
fibroglandular density.
FINDINGS: There are no findings suspicious for malignancy. Images were
processed with CAD.
IMPRESSION: No mammographic evidence of malignancy. A result letter of this
screening mammogram will be mailed directly to the patient.

RECOMMENDATION:
Screening mammogram in one year. (Code:[HN])

BI-RADS CATEGORY  1: Negative

## 2012-12-23 ENCOUNTER — Other Ambulatory Visit (HOSPITAL_COMMUNITY): Payer: Self-pay | Admitting: Family Medicine

## 2012-12-23 DIAGNOSIS — M549 Dorsalgia, unspecified: Secondary | ICD-10-CM

## 2012-12-29 ENCOUNTER — Ambulatory Visit (HOSPITAL_COMMUNITY)
Admission: RE | Admit: 2012-12-29 | Discharge: 2012-12-29 | Disposition: A | Discharge status: Home / Self Care | Payer: BC Managed Care – PPO | Source: Ambulatory Visit | Attending: Family Medicine | Admitting: Family Medicine

## 2012-12-29 DIAGNOSIS — M545 Low back pain, unspecified: Secondary | ICD-10-CM | POA: Insufficient documentation

## 2012-12-29 DIAGNOSIS — M51379 Other intervertebral disc degeneration, lumbosacral region without mention of lumbar back pain or lower extremity pain: Secondary | ICD-10-CM | POA: Insufficient documentation

## 2012-12-29 DIAGNOSIS — M539 Dorsopathy, unspecified: Secondary | ICD-10-CM | POA: Insufficient documentation

## 2012-12-29 DIAGNOSIS — M47817 Spondylosis without myelopathy or radiculopathy, lumbosacral region: Secondary | ICD-10-CM | POA: Insufficient documentation

## 2012-12-29 DIAGNOSIS — R29898 Other symptoms and signs involving the musculoskeletal system: Secondary | ICD-10-CM | POA: Insufficient documentation

## 2012-12-29 DIAGNOSIS — R209 Unspecified disturbances of skin sensation: Secondary | ICD-10-CM | POA: Insufficient documentation

## 2012-12-29 DIAGNOSIS — M549 Dorsalgia, unspecified: Secondary | ICD-10-CM

## 2012-12-29 DIAGNOSIS — M5137 Other intervertebral disc degeneration, lumbosacral region: Secondary | ICD-10-CM | POA: Insufficient documentation

## 2013-09-18 ENCOUNTER — Other Ambulatory Visit: Payer: Self-pay | Admitting: Neurosurgery

## 2013-09-18 DIAGNOSIS — M5126 Other intervertebral disc displacement, lumbar region: Secondary | ICD-10-CM

## 2013-09-30 ENCOUNTER — Ambulatory Visit
Admission: RE | Admit: 2013-09-30 | Discharge: 2013-09-30 | Disposition: A | Discharge status: Home / Self Care | Payer: BC Managed Care – PPO | Source: Ambulatory Visit | Attending: Neurosurgery | Admitting: Neurosurgery

## 2013-09-30 VITALS — BP 146/85 | HR 64 | Wt 208.0 lb

## 2013-09-30 DIAGNOSIS — M5126 Other intervertebral disc displacement, lumbar region: Secondary | ICD-10-CM

## 2013-09-30 MED ORDER — IOHEXOL 180 MG/ML  SOLN
20.0000 mL | Freq: Once | INTRAMUSCULAR | Status: AC | PRN
  Administered 2013-09-30: 20 mL via INTRATHECAL

## 2013-09-30 MED ORDER — DIAZEPAM 5 MG PO TABS
10.0000 mg | ORAL_TABLET | Freq: Once | ORAL | Status: AC
  Administered 2013-09-30: 10 mg via ORAL

## 2013-09-30 NOTE — Discharge Instructions (Signed)

## 2014-03-22 ENCOUNTER — Other Ambulatory Visit (HOSPITAL_COMMUNITY): Payer: Self-pay | Admitting: Family Medicine

## 2014-03-22 DIAGNOSIS — Z139 Encounter for screening, unspecified: Secondary | ICD-10-CM

## 2014-03-25 ENCOUNTER — Ambulatory Visit (HOSPITAL_COMMUNITY): Payer: Self-pay

## 2014-03-26 ENCOUNTER — Ambulatory Visit (HOSPITAL_COMMUNITY)
Admission: RE | Admit: 2014-03-26 | Discharge: 2014-03-26 | Disposition: A | Discharge status: Home / Self Care | Payer: BLUE CROSS/BLUE SHIELD | Source: Ambulatory Visit | Attending: Family Medicine | Admitting: Family Medicine

## 2014-03-26 ENCOUNTER — Encounter (INDEPENDENT_AMBULATORY_CARE_PROVIDER_SITE_OTHER): Payer: Self-pay | Admitting: *Deleted

## 2014-03-26 DIAGNOSIS — Z139 Encounter for screening, unspecified: Secondary | ICD-10-CM

## 2014-03-26 DIAGNOSIS — Z1231 Encounter for screening mammogram for malignant neoplasm of breast: Secondary | ICD-10-CM | POA: Insufficient documentation

## 2014-03-30 ENCOUNTER — Other Ambulatory Visit: Payer: Self-pay | Admitting: Family Medicine

## 2014-03-30 DIAGNOSIS — R928 Other abnormal and inconclusive findings on diagnostic imaging of breast: Secondary | ICD-10-CM

## 2014-04-06 ENCOUNTER — Other Ambulatory Visit: Payer: Self-pay | Admitting: Family Medicine

## 2014-04-06 ENCOUNTER — Encounter (INDEPENDENT_AMBULATORY_CARE_PROVIDER_SITE_OTHER): Payer: Self-pay | Admitting: *Deleted

## 2014-04-06 ENCOUNTER — Ambulatory Visit
Admission: RE | Admit: 2014-04-06 | Discharge: 2014-04-06 | Disposition: A | Discharge status: Home / Self Care | Payer: BLUE CROSS/BLUE SHIELD | Source: Ambulatory Visit | Attending: Family Medicine | Admitting: Family Medicine

## 2014-04-06 ENCOUNTER — Other Ambulatory Visit (INDEPENDENT_AMBULATORY_CARE_PROVIDER_SITE_OTHER): Payer: Self-pay | Admitting: *Deleted

## 2014-04-06 DIAGNOSIS — R928 Other abnormal and inconclusive findings on diagnostic imaging of breast: Secondary | ICD-10-CM

## 2014-04-06 DIAGNOSIS — Z1211 Encounter for screening for malignant neoplasm of colon: Secondary | ICD-10-CM

## 2014-04-21 ENCOUNTER — Telehealth (INDEPENDENT_AMBULATORY_CARE_PROVIDER_SITE_OTHER): Payer: Self-pay | Admitting: *Deleted

## 2014-04-21 DIAGNOSIS — Z1211 Encounter for screening for malignant neoplasm of colon: Secondary | ICD-10-CM

## 2014-04-21 NOTE — Telephone Encounter (Signed)
Patient needs trilyte 

## 2014-04-22 MED ORDER — PEG 3350-KCL-NA BICARB-NACL 420 G PO SOLR: 4000.0000 mL | Freq: Once | ORAL | Status: DC

## 2014-05-11 ENCOUNTER — Telehealth (INDEPENDENT_AMBULATORY_CARE_PROVIDER_SITE_OTHER): Payer: Self-pay | Admitting: *Deleted

## 2014-05-11 NOTE — Telephone Encounter (Signed)
Referring MD/PCP: koberlein   Procedure: tcs  Reason/Indication:  screening  Has patient had this procedure before?  no  If so, when, by whom and where?    Is there a family history of colon cancer?  Not sure  Who?  What age when diagnosed?    Is patient diabetic?   no      Does patient have prosthetic heart valve?  no  Do you have a pacemaker?  no  Has patient ever had endocarditis? no  Has patient had joint replacement within last 12 months?  no  Does patient tend to be constipated or take laxatives? yes  Is patient on Coumadin, Plavix and/or Aspirin? yes  Medications: asa 81 mg daily, ergocalciferol 50,000 units 1 tab every Friday, flexeril 10 mg (on her days off), cymbalta 30 mg (on her days off), aleve (on days she works), zyrtec daily  Allergies: nkda  Medication Adjustment: asa 2 days  Procedure date & time: 06/10/14 at 930

## 2014-05-11 NOTE — Telephone Encounter (Signed)
agree

## 2014-06-10 ENCOUNTER — Ambulatory Visit (HOSPITAL_COMMUNITY)
Admission: RE | Admit: 2014-06-10 | Discharge: 2014-06-10 | Disposition: A | Discharge status: Home / Self Care | Payer: BLUE CROSS/BLUE SHIELD | Source: Ambulatory Visit | Attending: Internal Medicine | Admitting: Internal Medicine

## 2014-06-10 ENCOUNTER — Encounter (HOSPITAL_COMMUNITY): Payer: Self-pay | Admitting: *Deleted

## 2014-06-10 ENCOUNTER — Encounter (HOSPITAL_COMMUNITY)
Admission: RE | Disposition: A | Discharge status: Home / Self Care | Payer: Self-pay | Source: Ambulatory Visit | Attending: Internal Medicine

## 2014-06-10 DIAGNOSIS — K644 Residual hemorrhoidal skin tags: Secondary | ICD-10-CM | POA: Diagnosis not present

## 2014-06-10 DIAGNOSIS — K6289 Other specified diseases of anus and rectum: Secondary | ICD-10-CM | POA: Insufficient documentation

## 2014-06-10 DIAGNOSIS — Z87891 Personal history of nicotine dependence: Secondary | ICD-10-CM | POA: Insufficient documentation

## 2014-06-10 DIAGNOSIS — Z7982 Long term (current) use of aspirin: Secondary | ICD-10-CM | POA: Insufficient documentation

## 2014-06-10 DIAGNOSIS — J302 Other seasonal allergic rhinitis: Secondary | ICD-10-CM | POA: Diagnosis not present

## 2014-06-10 DIAGNOSIS — M1990 Unspecified osteoarthritis, unspecified site: Secondary | ICD-10-CM | POA: Diagnosis not present

## 2014-06-10 DIAGNOSIS — Z1211 Encounter for screening for malignant neoplasm of colon: Secondary | ICD-10-CM | POA: Insufficient documentation

## 2014-06-10 DIAGNOSIS — Z79899 Other long term (current) drug therapy: Secondary | ICD-10-CM | POA: Diagnosis not present

## 2014-06-10 DIAGNOSIS — K649 Unspecified hemorrhoids: Secondary | ICD-10-CM | POA: Diagnosis not present

## 2014-06-10 HISTORY — PX: COLONOSCOPY: SHX5424

## 2014-06-10 HISTORY — DX: Other seasonal allergic rhinitis: J30.2

## 2014-06-10 SURGERY — COLONOSCOPY
Anesthesia: Moderate Sedation

## 2014-06-10 MED ORDER — MEPERIDINE HCL 50 MG/ML IJ SOLN
INTRAMUSCULAR | Status: DC | PRN
  Administered 2014-06-10 (×2): 25 mg

## 2014-06-10 MED ORDER — MIDAZOLAM HCL 5 MG/5ML IJ SOLN
INTRAMUSCULAR | Status: AC
  Filled 2014-06-10: qty 10

## 2014-06-10 MED ORDER — MEPERIDINE HCL 50 MG/ML IJ SOLN
INTRAMUSCULAR | Status: AC
  Filled 2014-06-10: qty 1

## 2014-06-10 MED ORDER — SODIUM CHLORIDE 0.9 % IV SOLN
INTRAVENOUS | Status: DC
  Administered 2014-06-10: 09:00:00 via INTRAVENOUS

## 2014-06-10 MED ORDER — SIMETHICONE 40 MG/0.6ML PO SUSP
ORAL | Status: DC | PRN
  Administered 2014-06-10: 10:00:00

## 2014-06-10 MED ORDER — MIDAZOLAM HCL 5 MG/5ML IJ SOLN
INTRAMUSCULAR | Status: DC | PRN
Start: 2014-06-10 — End: 2014-06-10
  Administered 2014-06-10 (×3): 2 mg via INTRAVENOUS

## 2014-06-10 NOTE — H&P (Signed)
Lynn ParkerLinda J Gill is an 49 y.o. female.   Chief Complaint: Patient is here for colonoscopy. HPI: Patient is 49 year old African-American female who is here for screening colonoscopy. She denies abdominal pain change in bowel habits or rectal bleeding. History is negative for CRC.  Past Medical History  Diagnosis Date  . Arthritis   . Seasonal allergies     Past Surgical History  Procedure Laterality Date  . Abdominal hysterectomy    . Salpingectomy    . Bilateral carpal tunnel release      Family History  Problem Relation Age of Onset  . Colon cancer Brother    Social History:  reports that she has quit smoking. She does not have any smokeless tobacco history on file. She reports that she does not drink alcohol or use illicit drugs.  Allergies:  Allergies  Allergen Reactions  . Other Hives    Strawberries    Medications Prior to Admission  Medication Sig Dispense Refill  . aspirin EC 81 MG tablet Take 81 mg by mouth daily.    . cetirizine (ZYRTEC) 10 MG tablet Take 10 mg by mouth daily.    . cyclobenzaprine (FLEXERIL) 5 MG tablet Take 5 mg by mouth 3 (three) times daily as needed for muscle spasms.    . polyethylene glycol-electrolytes (NULYTELY/GOLYTELY) 420 G solution Take 4,000 mLs by mouth once. 4000 mL 0  . Vitamin D, Ergocalciferol, (DRISDOL) 50000 UNITS CAPS capsule Take 50,000 Units by mouth every 7 (seven) days. Takes on Friday      No results found for this or any previous visit (from the past 48 hour(s)). No results found.  ROS  Blood pressure 136/70, pulse 68, temperature 97.6 F (36.4 C), temperature source Oral, resp. rate 16, height 5\' 6"  (1.676 m), weight 208 lb (94.348 kg), SpO2 100 %. Physical Exam  Constitutional: She appears well-developed and well-nourished.  HENT:  Mouth/Throat: Oropharynx is clear and moist.  Eyes: Conjunctivae are normal. No scleral icterus.  Neck: No thyromegaly present.  Cardiovascular: Normal rate, regular rhythm and  normal heart sounds.   No murmur heard. Respiratory: Effort normal and breath sounds normal.  GI: Soft. She exhibits no distension and no mass. There is no tenderness.  Musculoskeletal: She exhibits no edema.  Lymphadenopathy:    She has no cervical adenopathy.  Neurological: She is alert.  Skin: Skin is warm.     Assessment/Plan Average risk screening colonoscopy  Lynn Gill U 06/10/2014, 9:32 AM

## 2014-06-10 NOTE — Discharge Instructions (Signed)
Resume usual medications and diet. °No driving for 24 hours. °Next screening exam in 10 years. ° ° °Colonoscopy, Care After °These instructions give you information on caring for yourself after your procedure. Your doctor may also give you more specific instructions. Call your doctor if you have any problems or questions after your procedure. °HOME CARE °· Do not drive for 24 hours. °· Do not sign important papers or use machinery for 24 hours. °· You may shower. °· You may go back to your usual activities, but go slower for the first 24 hours. °· Take rest breaks often during the first 24 hours. °· Walk around or use warm packs on your belly (abdomen) if you have belly cramping or gas. °· Drink enough fluids to keep your pee (urine) clear or pale yellow. °· Resume your normal diet. Avoid heavy or fried foods. °· Avoid drinking alcohol for 24 hours or as told by your doctor. °· Only take medicines as told by your doctor. °If a tissue sample (biopsy) was taken during the procedure:  °· Do not take aspirin or blood thinners for 7 days, or as told by your doctor. °· Do not drink alcohol for 7 days, or as told by your doctor. °· Eat soft foods for the first 24 hours. °GET HELP IF: °You still have a small amount of blood in your poop (stool) 2-3 days after the procedure. °GET HELP RIGHT AWAY IF: °· You have more than a small amount of blood in your poop. °· You see clumps of tissue (blood clots) in your poop. °· Your belly is puffy (swollen). °· You feel sick to your stomach (nauseous) or throw up (vomit). °· You have a fever. °· You have belly pain that gets worse and medicine does not help. °MAKE SURE YOU: °· Understand these instructions. °· Will watch your condition. °· Will get help right away if you are not doing well or get worse. °Document Released: 02/03/2010 Document Revised: 01/06/2013 Document Reviewed: 09/08/2012 °[employer]® Patient Information ©2015 [employer]. This information is not intended to replace  advice given to you by your health care provider. Make sure you discuss any questions you have with your health care provider. ° °

## 2014-06-10 NOTE — Op Note (Signed)
COLONOSCOPY PROCEDURE REPORT  PATIENT:  Lynn ParkerLinda J Gill  MR#:  [id number] Birthdate:  07/19/1965, 49 y.o., female Endoscopist:  Dr. Malissa HippoNajeeb U. Rehman, MD Referred By:  Dr. Trinna PostJunell Carol Koberlein, MD  Procedure Date: 06/10/2014  Procedure:   Colonoscopy  Indications:  Patient is 49 year old African-American female was undergoing average risk screening colonoscopy.  Informed Consent:  The procedure and risks were reviewed with the patient and informed consent was obtained.  Medications:  Demerol 50 mg IV Versed 6 mg IV  Description of procedure:  After a digital rectal exam was performed, that colonoscope was advanced from the anus through the rectum and colon to the area of the cecum, ileocecal valve and appendiceal orifice. The cecum was deeply intubated. These structures were well-seen and photographed for the record. From the level of the cecum and ileocecal valve, the scope was slowly and cautiously withdrawn. The mucosal surfaces were carefully surveyed utilizing scope tip to flexion to facilitate fold flattening as needed. The scope was pulled down into the rectum where a thorough exam including retroflexion was performed.  Findings:   Prep excellent. Normal mucosa of cecum, ascending colon, hepatic flexure, transverse colon, splenic flexure, descending and sigmoid colon. Normal rectal mucosa. Small external hemorrhoids below the dentate line and single anal papilla   Therapeutic/Diagnostic Maneuvers Performed: None  Complications:  None  Cecal Withdrawal Time:  11 minutes  Impression:  Examination performed to cecum. Small external hemorrhoids and single anal papilla otherwise normal colonoscopy.  Recommendations:  Standard instructions given. Next screening exam in 10 years.  REHMAN,NAJEEB U  06/10/2014 10:00 AM  CC: Dr. Hassan RowanKOBERLEIN, Su MonksJUNELL CAROL, MD & Dr. No ref. provider found

## 2014-06-11 ENCOUNTER — Encounter (HOSPITAL_COMMUNITY): Payer: Self-pay | Admitting: Internal Medicine

## 2015-04-22 ENCOUNTER — Other Ambulatory Visit: Payer: Self-pay | Admitting: Neurosurgery

## 2015-04-22 DIAGNOSIS — M4716 Other spondylosis with myelopathy, lumbar region: Secondary | ICD-10-CM

## 2015-05-02 ENCOUNTER — Ambulatory Visit
Admission: RE | Admit: 2015-05-02 | Discharge: 2015-05-02 | Disposition: A | Discharge status: Home / Self Care | Payer: BLUE CROSS/BLUE SHIELD | Source: Ambulatory Visit | Attending: Neurosurgery | Admitting: Neurosurgery

## 2015-05-02 DIAGNOSIS — M4716 Other spondylosis with myelopathy, lumbar region: Secondary | ICD-10-CM

## 2015-05-02 MED ORDER — IOHEXOL 180 MG/ML  SOLN
1.0000 mL | Freq: Once | INTRAMUSCULAR | Status: AC | PRN
  Administered 2015-05-02: 1 mL via INTRA_ARTICULAR

## 2015-05-02 MED ORDER — METHYLPREDNISOLONE ACETATE 40 MG/ML INJ SUSP (RADIOLOG
120.0000 mg | Freq: Once | INTRAMUSCULAR | Status: AC
  Administered 2015-05-02: 120 mg via INTRA_ARTICULAR

## 2015-05-02 NOTE — Discharge Instructions (Signed)

## 2015-06-17 ENCOUNTER — Other Ambulatory Visit: Payer: Self-pay | Admitting: Neurosurgery

## 2015-06-17 DIAGNOSIS — M4716 Other spondylosis with myelopathy, lumbar region: Secondary | ICD-10-CM

## 2015-07-05 ENCOUNTER — Ambulatory Visit
Admission: RE | Admit: 2015-07-05 | Discharge: 2015-07-05 | Disposition: A | Discharge status: Home / Self Care | Payer: BLUE CROSS/BLUE SHIELD | Source: Ambulatory Visit | Attending: Neurosurgery | Admitting: Neurosurgery

## 2015-07-05 DIAGNOSIS — M4716 Other spondylosis with myelopathy, lumbar region: Secondary | ICD-10-CM

## 2015-07-05 NOTE — Discharge Instructions (Signed)

## 2015-12-06 ENCOUNTER — Encounter (HOSPITAL_COMMUNITY): Payer: Self-pay | Admitting: Emergency Medicine

## 2015-12-06 ENCOUNTER — Emergency Department (HOSPITAL_COMMUNITY)
Admission: EM | Admit: 2015-12-06 | Discharge: 2015-12-06 | Disposition: A | Discharge status: Home / Self Care | Payer: BLUE CROSS/BLUE SHIELD | Attending: Emergency Medicine | Admitting: Emergency Medicine

## 2015-12-06 DIAGNOSIS — R197 Diarrhea, unspecified: Secondary | ICD-10-CM | POA: Insufficient documentation

## 2015-12-06 DIAGNOSIS — Z87891 Personal history of nicotine dependence: Secondary | ICD-10-CM | POA: Insufficient documentation

## 2015-12-06 DIAGNOSIS — Z79899 Other long term (current) drug therapy: Secondary | ICD-10-CM | POA: Insufficient documentation

## 2015-12-06 DIAGNOSIS — R51 Headache: Secondary | ICD-10-CM | POA: Diagnosis not present

## 2015-12-06 DIAGNOSIS — Z7982 Long term (current) use of aspirin: Secondary | ICD-10-CM | POA: Insufficient documentation

## 2015-12-06 DIAGNOSIS — R112 Nausea with vomiting, unspecified: Secondary | ICD-10-CM | POA: Insufficient documentation

## 2015-12-06 MED ORDER — DIPHENOXYLATE-ATROPINE 2.5-0.025 MG PO TABS: 1.0000 | ORAL_TABLET | Freq: Four times a day (QID) | ORAL | 0 refills | Status: AC | PRN

## 2015-12-06 MED ORDER — SODIUM CHLORIDE 0.9 % IV BOLUS (SEPSIS)
1000.0000 mL | Freq: Once | INTRAVENOUS | Status: AC
  Administered 2015-12-06: 1000 mL via INTRAVENOUS

## 2015-12-06 MED ORDER — KETOROLAC TROMETHAMINE 30 MG/ML IJ SOLN
30.0000 mg | Freq: Once | INTRAMUSCULAR | Status: AC
  Administered 2015-12-06: 30 mg via INTRAVENOUS
  Filled 2015-12-06: qty 1

## 2015-12-06 MED ORDER — METOCLOPRAMIDE HCL 5 MG/ML IJ SOLN
10.0000 mg | Freq: Once | INTRAMUSCULAR | Status: AC
  Administered 2015-12-06: 10 mg via INTRAVENOUS
  Filled 2015-12-06: qty 2

## 2015-12-06 MED ORDER — ONDANSETRON HCL 4 MG/2ML IJ SOLN
4.0000 mg | Freq: Once | INTRAMUSCULAR | Status: AC
  Administered 2015-12-06: 4 mg via INTRAVENOUS
  Filled 2015-12-06: qty 2

## 2015-12-06 MED ORDER — ONDANSETRON HCL 8 MG PO TABS: 8.0000 mg | ORAL_TABLET | ORAL | 0 refills | Status: AC | PRN

## 2015-12-06 MED ORDER — DIPHENHYDRAMINE HCL 50 MG/ML IJ SOLN
25.0000 mg | Freq: Once | INTRAMUSCULAR | Status: AC
  Administered 2015-12-06: 25 mg via INTRAVENOUS
  Filled 2015-12-06: qty 1

## 2015-12-06 NOTE — Discharge Instructions (Signed)
Rest. Increase fluids. Medication for nausea and diarrhea.

## 2015-12-06 NOTE — ED Provider Notes (Signed)
[facility]-EMERGENCY DEPT Provider Note   CSN: [id number] Arrival date & time: 12/06/15  [id number]  By signing my name below, I, Placido SouLogan Joldersma, attest that this documentation has been prepared under the direction and in the presence of Donnetta HutchingBrian Greer Koeppen, MD. Electronically Signed: Placido SouLogan Joldersma, ED Scribe. 12/06/15. 9:51 AM.   History   Chief Complaint Chief Complaint  Patient presents with  . Emesis    HPI HPI Comments: Lynn Gill is a 50 y.o. female who presents to the Emergency Department complaining of n/v onset last night at 3:00 AM. She reports associated diarrhea, weakness, fatigue and a diffuse HA. She states her vomiting has been persistent since onset. Pt denies these are typical symptoms for her. She denies any recent known sick contacts. Pt confirms being ambulatory. No other associated symptoms at this time.   The history is provided by the patient. No language interpreter was used.    Past Medical History:  Diagnosis Date  . Arthritis   . Seasonal allergies     There are no active problems to display for this patient.   Past Surgical History:  Procedure Laterality Date  . ABDOMINAL HYSTERECTOMY    . BILATERAL CARPAL TUNNEL RELEASE    . COLONOSCOPY N/A 06/10/2014   Procedure: COLONOSCOPY;  Surgeon: Malissa HippoNajeeb U Rehman, MD;  Location: [facility];  Service: Endoscopy;  Laterality: N/A;  930  . SALPINGECTOMY      OB History    No data available       Home Medications    Prior to Admission medications   Medication Sig Start Date End Date Taking? Authorizing Provider  aspirin EC 81 MG tablet Take 81 mg by mouth daily.   Yes Historical Provider, MD  cetirizine (ZYRTEC) 10 MG tablet Take 10 mg by mouth daily.   Yes Historical Provider, MD  meloxicam (MOBIC) 15 MG tablet Take 1 tablet by mouth at bedtime.  11/02/15  Yes Historical Provider, MD  diphenoxylate-atropine (LOMOTIL) 2.5-0.025 MG tablet Take 1 tablet by mouth 4 (four) times daily as needed for diarrhea or  loose stools. 12/06/15   Donnetta HutchingBrian Dago Jungwirth, MD  ondansetron (ZOFRAN) 8 MG tablet Take 1 tablet (8 mg total) by mouth every 4 (four) hours as needed. 12/06/15   Donnetta HutchingBrian Rajvir Ernster, MD    Family History Family History  Problem Relation Age of Onset  . Colon cancer Brother     Social History Social History  Substance Use Topics  . Smoking status: Former Smoker    Packs/day: 0.50    Years: 20.00  . Smokeless tobacco: Not on file  . Alcohol use No     Allergies   Other   Review of Systems Review of Systems  Constitutional: Positive for fatigue.  Gastrointestinal: Positive for diarrhea, nausea and vomiting.  Neurological: Positive for weakness and headaches.  All other systems reviewed and are negative.  Physical Exam Updated Vital Signs BP 119/70   Pulse 79   Temp 97.5 F (36.4 C) (Oral)   Resp 22   Ht 5\' 6"  (1.676 m)   Wt 190 lb (86.2 kg)   SpO2 98%   BMI 30.67 kg/m   Physical Exam  Constitutional: She is oriented to person, place, and time. She appears well-developed and well-nourished.  HENT:  Head: Normocephalic and atraumatic.  Eyes: Conjunctivae are normal.  Neck: Neck supple.  Cardiovascular: Normal rate and regular rhythm.   Pulmonary/Chest: Effort normal and breath sounds normal.  Abdominal: Soft. Bowel sounds are normal.  Musculoskeletal: Normal  range of motion.  Neurological: She is alert and oriented to person, place, and time.  Skin: Skin is warm and dry.  Psychiatric: She has a normal mood and affect. Her behavior is normal.  Nursing note and vitals reviewed.  ED Treatments / Results  Labs (all labs ordered are listed, but only abnormal results are displayed) Labs Reviewed - No data to display  EKG  EKG Interpretation None       Radiology No results found.  Procedures Procedures  DIAGNOSTIC STUDIES: Oxygen Saturation is 100% on RA, normal by my interpretation.    COORDINATION OF CARE: 9:50 AM Discussed next steps with pt. Pt verbalized  understanding and is agreeable with the plan.    Medications Ordered in ED Medications  ondansetron (ZOFRAN) injection 4 mg (4 mg Intravenous Given 12/06/15 1030)  sodium chloride 0.9 % bolus 1,000 mL (0 mLs Intravenous Stopped 12/06/15 1238)  sodium chloride 0.9 % bolus 1,000 mL (0 mLs Intravenous Stopped 12/06/15 1137)  ketorolac (TORADOL) 30 MG/ML injection 30 mg (30 mg Intravenous Given 12/06/15 1048)  metoCLOPramide (REGLAN) injection 10 mg (10 mg Intravenous Given 12/06/15 1048)  diphenhydrAMINE (BENADRYL) injection 25 mg (25 mg Intravenous Given 12/06/15 1048)     Initial Impression / Assessment and Plan / ED Course  I have reviewed the triage vital signs and the nursing notes.  Pertinent labs & imaging results that were available during my care of the patient were reviewed by me and considered in my medical decision making (see chart for details).  Clinical Course    Patient feels much better after IV fluids and pain management. No evidence of meningitis. Discharge medications Zofran 8 mg and Lomotil.  I personally performed the services described in this documentation, which was scribed in my presence. The recorded information has been reviewed and is accurate.   Final Clinical Impressions(s) / ED Diagnoses   Final diagnoses:  Nausea vomiting and diarrhea    New Prescriptions New Prescriptions   DIPHENOXYLATE-ATROPINE (LOMOTIL) 2.5-0.025 MG TABLET    Take 1 tablet by mouth 4 (four) times daily as needed for diarrhea or loose stools.   ONDANSETRON (ZOFRAN) 8 MG TABLET    Take 1 tablet (8 mg total) by mouth every 4 (four) hours as needed.     Donnetta HutchingBrian Ronan Dion, MD 12/06/15 [phone]

## 2015-12-06 NOTE — ED Triage Notes (Signed)
Pt reports n/v/d since 3 am today with h/a from throwing up.  Pt alert and oriented at this time.

## 2016-08-28 ENCOUNTER — Other Ambulatory Visit: Payer: Self-pay | Admitting: Nurse Practitioner

## 2016-08-28 DIAGNOSIS — M47817 Spondylosis without myelopathy or radiculopathy, lumbosacral region: Secondary | ICD-10-CM

## 2016-09-12 ENCOUNTER — Other Ambulatory Visit: Payer: Self-pay | Admitting: Nurse Practitioner

## 2016-09-12 ENCOUNTER — Ambulatory Visit
Admission: RE | Admit: 2016-09-12 | Discharge: 2016-09-12 | Disposition: A | Discharge status: Home / Self Care | Payer: BLUE CROSS/BLUE SHIELD | Source: Ambulatory Visit | Attending: Nurse Practitioner | Admitting: Nurse Practitioner

## 2016-09-12 DIAGNOSIS — M47817 Spondylosis without myelopathy or radiculopathy, lumbosacral region: Secondary | ICD-10-CM

## 2016-10-01 MED ORDER — SODIUM CHLORIDE 0.9 % IV SOLN
Freq: Once | INTRAVENOUS | Status: AC
  Administered 2016-10-02: 08:00:00 via INTRAVENOUS

## 2016-10-01 MED ORDER — KETOROLAC TROMETHAMINE 30 MG/ML IJ SOLN
30.0000 mg | Freq: Once | INTRAMUSCULAR | Status: AC
  Administered 2016-10-02: 30 mg via INTRAVENOUS

## 2016-10-01 MED ORDER — FENTANYL CITRATE (PF) 100 MCG/2ML IJ SOLN
25.0000 ug | INTRAMUSCULAR | Status: DC | PRN
  Administered 2016-10-02: 25 ug via INTRAVENOUS

## 2016-10-01 MED ORDER — MIDAZOLAM HCL 2 MG/2ML IJ SOLN
1.0000 mg | INTRAMUSCULAR | Status: DC | PRN
  Administered 2016-10-02 (×2): 0.5 mg via INTRAVENOUS

## 2016-10-01 NOTE — Discharge Instructions (Signed)
Radio Frequency Ablation Procedure Discharge Instructions  1. May resume a regular diet and any medications that you routinely take (including pain medications). 2. No driving day of procedure. 3. Upon discharge go home and rest for at least 4 hours.  May use an ice pack as needed to injection sites on back.  Ice to back 30 minutes on and 30 minutes off, all day. 4. May remove bandades later, today. 5. It is not unusual to be sore for several days after this procedure.    Please contact our office at [phone] for the following symptoms:   Fever greater than 100 degrees  Increased swelling, pain, or redness at injection site.   Thank you for visiting [facility].

## 2016-10-02 ENCOUNTER — Other Ambulatory Visit: Payer: Self-pay | Admitting: Nurse Practitioner

## 2016-10-02 ENCOUNTER — Ambulatory Visit
Admission: RE | Admit: 2016-10-02 | Discharge: 2016-10-02 | Disposition: A | Discharge status: Home / Self Care | Payer: BLUE CROSS/BLUE SHIELD | Source: Ambulatory Visit | Attending: Nurse Practitioner | Admitting: Nurse Practitioner

## 2016-10-02 VITALS — BP 127/73 | HR 57 | Temp 97.5°F | Resp 10

## 2016-10-02 DIAGNOSIS — M47817 Spondylosis without myelopathy or radiculopathy, lumbosacral region: Secondary | ICD-10-CM

## 2021-05-04 DIAGNOSIS — Z1231 Encounter for screening mammogram for malignant neoplasm of breast: Secondary | ICD-10-CM | POA: Diagnosis not present

## 2021-05-24 DIAGNOSIS — Z6836 Body mass index (BMI) 36.0-36.9, adult: Secondary | ICD-10-CM | POA: Diagnosis not present

## 2021-05-24 DIAGNOSIS — Z1331 Encounter for screening for depression: Secondary | ICD-10-CM | POA: Diagnosis not present

## 2021-05-24 DIAGNOSIS — R5383 Other fatigue: Secondary | ICD-10-CM | POA: Diagnosis not present

## 2021-05-24 DIAGNOSIS — Z79899 Other long term (current) drug therapy: Secondary | ICD-10-CM | POA: Diagnosis not present

## 2021-05-24 DIAGNOSIS — Z0000 Encounter for general adult medical examination without abnormal findings: Secondary | ICD-10-CM | POA: Diagnosis not present

## 2021-05-24 DIAGNOSIS — Z789 Other specified health status: Secondary | ICD-10-CM | POA: Diagnosis not present

## 2021-05-24 DIAGNOSIS — Z299 Encounter for prophylactic measures, unspecified: Secondary | ICD-10-CM | POA: Diagnosis not present

## 2021-05-25 DIAGNOSIS — Z0000 Encounter for general adult medical examination without abnormal findings: Secondary | ICD-10-CM | POA: Diagnosis not present

## 2022-09-05 DIAGNOSIS — Z299 Encounter for prophylactic measures, unspecified: Secondary | ICD-10-CM | POA: Diagnosis not present

## 2022-09-05 DIAGNOSIS — Z1331 Encounter for screening for depression: Secondary | ICD-10-CM | POA: Diagnosis not present

## 2022-09-05 DIAGNOSIS — Z79899 Other long term (current) drug therapy: Secondary | ICD-10-CM | POA: Diagnosis not present

## 2022-09-05 DIAGNOSIS — R5383 Other fatigue: Secondary | ICD-10-CM | POA: Diagnosis not present

## 2022-09-05 DIAGNOSIS — Z0000 Encounter for general adult medical examination without abnormal findings: Secondary | ICD-10-CM | POA: Diagnosis not present

## 2022-09-05 DIAGNOSIS — E7800 Pure hypercholesterolemia, unspecified: Secondary | ICD-10-CM | POA: Diagnosis not present

## 2023-09-11 DIAGNOSIS — Z0000 Encounter for general adult medical examination without abnormal findings: Secondary | ICD-10-CM | POA: Diagnosis not present

## 2023-09-11 DIAGNOSIS — E7800 Pure hypercholesterolemia, unspecified: Secondary | ICD-10-CM | POA: Diagnosis not present

## 2023-09-11 DIAGNOSIS — Z79899 Other long term (current) drug therapy: Secondary | ICD-10-CM | POA: Diagnosis not present

## 2023-09-11 DIAGNOSIS — R5383 Other fatigue: Secondary | ICD-10-CM | POA: Diagnosis not present

## 2023-09-11 DIAGNOSIS — Z299 Encounter for prophylactic measures, unspecified: Secondary | ICD-10-CM | POA: Diagnosis not present

## 2023-09-11 DIAGNOSIS — Z1331 Encounter for screening for depression: Secondary | ICD-10-CM | POA: Diagnosis not present

## 2023-09-12 DIAGNOSIS — Z1231 Encounter for screening mammogram for malignant neoplasm of breast: Secondary | ICD-10-CM | POA: Diagnosis not present
